# Patient Record
Sex: Female | Born: 1995 | Race: White | Hispanic: No | Marital: Single | Smoking: Current some day smoker
Health system: Southern US, Community
[De-identification: ages and names within clinical notes are randomized; demographics above are authoritative.]

---

## 2015-03-06 ENCOUNTER — Emergency Department (HOSPITAL_COMMUNITY): Payer: Medicaid - Out of State

## 2015-03-06 ENCOUNTER — Encounter (HOSPITAL_COMMUNITY): Payer: Self-pay | Admitting: *Deleted

## 2015-03-06 ENCOUNTER — Emergency Department (HOSPITAL_COMMUNITY)
Admission: EM | Admit: 2015-03-06 | Discharge: 2015-03-06 | Disposition: A | Discharge status: Home / Self Care | Payer: Medicaid - Out of State | Attending: Emergency Medicine | Admitting: Emergency Medicine

## 2015-03-06 DIAGNOSIS — Y9389 Activity, other specified: Secondary | ICD-10-CM | POA: Diagnosis not present

## 2015-03-06 DIAGNOSIS — S93401A Sprain of unspecified ligament of right ankle, initial encounter: Secondary | ICD-10-CM

## 2015-03-06 DIAGNOSIS — Y998 Other external cause status: Secondary | ICD-10-CM | POA: Insufficient documentation

## 2015-03-06 DIAGNOSIS — Z720 Tobacco use: Secondary | ICD-10-CM | POA: Insufficient documentation

## 2015-03-06 DIAGNOSIS — Y9289 Other specified places as the place of occurrence of the external cause: Secondary | ICD-10-CM | POA: Diagnosis not present

## 2015-03-06 DIAGNOSIS — S99911A Unspecified injury of right ankle, initial encounter: Secondary | ICD-10-CM | POA: Diagnosis present

## 2015-03-06 DIAGNOSIS — W1839XA Other fall on same level, initial encounter: Secondary | ICD-10-CM | POA: Diagnosis not present

## 2015-03-06 MED ORDER — HYDROCODONE-ACETAMINOPHEN 5-325 MG PO TABS
1.0000 | ORAL_TABLET | Freq: Once | ORAL | Status: AC
  Administered 2015-03-06: 1 via ORAL
  Filled 2015-03-06: qty 1

## 2015-03-06 MED ORDER — HYDROCODONE-ACETAMINOPHEN 5-325 MG PO TABS: 1.0000 | ORAL_TABLET | ORAL | Status: DC | PRN

## 2015-03-06 MED ORDER — IBUPROFEN 800 MG PO TABS: 800.0000 mg | ORAL_TABLET | Freq: Three times a day (TID) | ORAL | Status: DC

## 2015-03-06 NOTE — Discharge Instructions (Signed)
Nonweightbearing for the next 48-72 hours. After 72 hours, slowly start bearing weight. When you can walk without pain or limp, discontinue the crutches.  Ankle Sprain An ankle sprain is an injury to the strong, fibrous tissues (ligaments) that hold the bones of your ankle joint together.  CAUSES An ankle sprain is usually caused by a fall or by twisting your ankle. Ankle sprains most commonly occur when you step on the outer edge of your foot, and your ankle turns inward. People who participate in sports are more prone to these types of injuries.  SYMPTOMS   Pain in your ankle. The pain may be present at rest or only when you are trying to stand or walk.  Swelling.  Bruising. Bruising may develop immediately or within 1 to 2 days after your injury.  Difficulty standing or walking, particularly when turning corners or changing directions. DIAGNOSIS  Your caregiver will ask you details about your injury and perform a physical exam of your ankle to determine if you have an ankle sprain. During the physical exam, your caregiver will press on and apply pressure to specific areas of your foot and ankle. Your caregiver will try to move your ankle in certain ways. An X-ray exam may be done to be sure a bone was not broken or a ligament did not separate from one of the bones in your ankle (avulsion fracture).  TREATMENT  Certain types of braces can help stabilize your ankle. Your caregiver can make a recommendation for this. Your caregiver may recommend the use of medicine for pain. If your sprain is severe, your caregiver may refer you to a surgeon who helps to restore function to parts of your skeletal system (orthopedist) or a physical therapist. HOME CARE INSTRUCTIONS   Apply ice to your injury for 1-2 days or as directed by your caregiver. Applying ice helps to reduce inflammation and pain.  Put ice in a plastic bag.  Place a towel between your skin and the bag.  Leave the ice on for 15-20  minutes at a time, every 2 hours while you are awake.  Only take over-the-counter or prescription medicines for pain, discomfort, or fever as directed by your caregiver.  Elevate your injured ankle above the level of your heart as much as possible for 2-3 days.  If your caregiver recommends crutches, use them as instructed. Gradually put weight on the affected ankle. Continue to use crutches or a cane until you can walk without feeling pain in your ankle.  If you have a plaster splint, wear the splint as directed by your caregiver. Do not rest it on anything harder than a pillow for the first 24 hours. Do not put weight on it. Do not get it wet. You may take it off to take a shower or bath.  You may have been given an elastic bandage to wear around your ankle to provide support. If the elastic bandage is too tight (you have numbness or tingling in your foot or your foot becomes cold and blue), adjust the bandage to make it comfortable.  If you have an air splint, you may blow more air into it or let air out to make it more comfortable. You may take your splint off at night and before taking a shower or bath. Wiggle your toes in the splint several times per day to decrease swelling. SEEK MEDICAL CARE IF:   You have rapidly increasing bruising or swelling.  Your toes feel extremely cold or you  lose feeling in your foot.  Your pain is not relieved with medicine. SEEK IMMEDIATE MEDICAL CARE IF:  Your toes are numb or blue.  You have severe pain that is increasing. MAKE SURE YOU:   Understand these instructions.  Will watch your condition.  Will get help right away if you are not doing well or get worse. Document Released: 06/19/2005 Document Revised: 03/13/2012 Document Reviewed: 07/01/2011 [facility] Patient Information 2015 [place], [place]. This information is not intended to replace advice given to you by your health care provider. Make sure you discuss any questions you have with your  health care provider.

## 2015-03-06 NOTE — ED Notes (Signed)
Pt is in stable condition upon d/c and is escorted from ED via wheelchair. 

## 2015-03-06 NOTE — ED Provider Notes (Signed)
CSN: [id number]     Arrival date & time 03/06/15  0907 History   First MD Initiated Contact with Patient 03/06/15 [phone]     Chief Complaint  Patient presents with  . Ankle Pain     HPI  Patient presents for evaluation of an ankle injury.  Ankle last night fell to ground. No additional areas of pain or injury. Swollen and tender ankle this morning. Cannot walk without limp.  History reviewed. No pertinent past medical history. History reviewed. No pertinent past surgical history. History reviewed. No pertinent family history. Social History  Substance Use Topics  . Smoking status: Current Some Day Smoker  . Smokeless tobacco: Never Used  . Alcohol Use: Yes     Comment: ocassionally   OB History    No data available     Review of Systems  Constitutional: Negative for fever, chills, diaphoresis, appetite change and fatigue.  HENT: Negative for mouth sores, sore throat and trouble swallowing.   Eyes: Negative for visual disturbance.  Respiratory: Negative for cough, chest tightness, shortness of breath and wheezing.   Cardiovascular: Negative for chest pain.  Gastrointestinal: Negative for nausea, vomiting, abdominal pain, diarrhea and abdominal distention.  Endocrine: Negative for polydipsia, polyphagia and polyuria.  Genitourinary: Negative for dysuria, frequency and hematuria.  Musculoskeletal: Positive for arthralgias and gait problem.       Right lateral ankle pain and swelling.  Skin: Negative for color change, pallor and rash.  Neurological: Negative for dizziness, syncope, light-headedness and headaches.  Hematological: Does not bruise/bleed easily.  Psychiatric/Behavioral: Negative for behavioral problems and confusion.      Allergies  Review of patient's allergies indicates no known allergies.  Home Medications   Prior to Admission medications   Medication Sig Start Date End Date Taking? Authorizing Provider  HYDROcodone-acetaminophen (NORCO/VICODIN) 5-325 MG  per tablet Take 1 tablet by mouth every 4 (four) hours as needed. 03/06/15   Rolland Porter, MD  ibuprofen (ADVIL,MOTRIN) 800 MG tablet Take 1 tablet (800 mg total) by mouth 3 (three) times daily. 03/06/15   Rolland Porter, MD   BP 114/67 mmHg  Pulse 91  Temp(Src) 98.1 F (36.7 C) (Oral)  Resp 17  Ht 5\' 2"  (1.575 m)  Wt 125 lb (56.7 kg)  BMI 22.86 kg/m2  SpO2 100%  LMP 02/03/2015 Physical Exam  Constitutional: She is oriented to person, place, and time. She appears well-developed and well-nourished. No distress.  HENT:  Head: Normocephalic.  Eyes: Conjunctivae are normal. Pupils are equal, round, and reactive to light. No scleral icterus.  Neck: Normal range of motion. Neck supple. No thyromegaly present.  Cardiovascular: Normal rate and regular rhythm.  Exam reveals no gallop and no friction rub.   No murmur heard. Pulmonary/Chest: Effort normal and breath sounds normal. No respiratory distress. She has no wheezes. She has no rales.  Abdominal: Soft. Bowel sounds are normal. She exhibits no distension. There is no tenderness. There is no rebound.  Musculoskeletal: Normal range of motion.       Feet:  Neurological: She is alert and oriented to person, place, and time.  Skin: Skin is warm and dry. No rash noted.  Psychiatric: She has a normal mood and affect. Her behavior is normal.    ED Course  Procedures (including critical care time) Labs Review Labs Reviewed - No data to display  Imaging Review Dg Ankle Complete Right  03/06/2015   CLINICAL DATA:  19 year old female with right lateral ankle pain and swelling for the past  day after a fall yesterday evening.  EXAM: RIGHT ANKLE - COMPLETE 3+ VIEW  COMPARISON:  No priors.  FINDINGS: Three views of the right ankle demonstrate extensive swelling overlying the lateral malleolus. No acute displaced fracture, subluxation or dislocation.  IMPRESSION: 1. Soft tissue swelling overlying the lateral malleolus, without underlying acute bony trauma.    Electronically Signed   By: Trudie Reed M.D.   On: 03/06/2015 10:18   I have personally reviewed and evaluated these images and lab results as part of my medical decision-making.   EKG Interpretation None      MDM   Final diagnoses:  Ankle sprain, right, initial encounter    Ace wrap crutches conservative treatment.    Rolland Porter, MD 03/06/15 1030

## 2015-03-06 NOTE — ED Notes (Signed)
Patient transported to X-ray 

## 2015-03-06 NOTE — ED Notes (Signed)
Pt arrives from home. Pt states she fell and hurt her right ankle last night. R ankle is swollen and tender to touch.

## 2016-03-22 ENCOUNTER — Emergency Department (HOSPITAL_COMMUNITY): Payer: Medicaid - Out of State

## 2016-03-22 ENCOUNTER — Emergency Department (HOSPITAL_COMMUNITY)
Admission: EM | Admit: 2016-03-22 | Discharge: 2016-03-22 | Disposition: A | Discharge status: Home / Self Care | Payer: Medicaid - Out of State | Attending: Emergency Medicine | Admitting: Emergency Medicine

## 2016-03-22 ENCOUNTER — Encounter (HOSPITAL_COMMUNITY): Payer: Self-pay | Admitting: Emergency Medicine

## 2016-03-22 DIAGNOSIS — Y929 Unspecified place or not applicable: Secondary | ICD-10-CM | POA: Diagnosis not present

## 2016-03-22 DIAGNOSIS — Y999 Unspecified external cause status: Secondary | ICD-10-CM | POA: Insufficient documentation

## 2016-03-22 DIAGNOSIS — X500XXA Overexertion from strenuous movement or load, initial encounter: Secondary | ICD-10-CM | POA: Insufficient documentation

## 2016-03-22 DIAGNOSIS — M25531 Pain in right wrist: Secondary | ICD-10-CM

## 2016-03-22 DIAGNOSIS — Y9389 Activity, other specified: Secondary | ICD-10-CM | POA: Insufficient documentation

## 2016-03-22 DIAGNOSIS — F17200 Nicotine dependence, unspecified, uncomplicated: Secondary | ICD-10-CM | POA: Insufficient documentation

## 2016-03-22 MED ORDER — IBUPROFEN 800 MG PO TABS: 800.0000 mg | ORAL_TABLET | Freq: Three times a day (TID) | ORAL | 0 refills | Status: AC | PRN

## 2016-03-22 MED ORDER — IBUPROFEN 800 MG PO TABS
800.0000 mg | ORAL_TABLET | Freq: Once | ORAL | Status: AC
  Administered 2016-03-22: 800 mg via ORAL
  Filled 2016-03-22: qty 1

## 2016-03-22 NOTE — ED Triage Notes (Signed)
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  Today she picked up a heavy back pack and then also fell on her right wrist.  She rates her pain as an 8.  She applied ice but that did not help the pain.  She took 2 Advil at 10 AM but that did not help the pain either.

## 2016-03-22 NOTE — Discharge Instructions (Signed)
Read the information below.  Use the prescribed medication as directed.  Please discuss all new medications with your pharmacist.  You may return to the Emergency Department at any time for worsening condition or any new symptoms that concern you.   If you develop uncontrolled pain, weakness or numbness of the extremity, severe discoloration of the skin, or you are unable to use your hand, return to the ER for a recheck.    °

## 2016-03-22 NOTE — ED Provider Notes (Signed)
[facility]-EMERGENCY DEPT Provider Note   CSN: [id number] Arrival date & time: 03/22/16  1445  By signing my name below, I, Sandrea HammondStephen Dignan, attest that this documentation has been prepared under the direction and in the presence of [facility] HospitalEmily Jeroline Wolbert, PA-C. Electronically Signed: Sandrea HammondStephen Dignan, ED Scribe. 03/22/16. 3:47 PM.   History   Chief Complaint Chief Complaint  Patient presents with  . Wrist Pain    HPI Comments: Nichole EarlsMia Bullock is a 20 y.o. female who presents to the Emergency Department complaining of moderate right wrist pain initial onset last week and worsed today s/p injury. Pt developed pain last week while making pottery and reports that her pain has been gradually worsening until today. Per pt, she lifted a heavy backpack which significantly worsened her pain and then subsequently she fell onto her right wrist. Pt denies LOC, head injury, or additional injuries. Pt denies numbness and tingling or weakness. Pt is right-handed.   The history is provided by the patient. No language interpreter was used.    History reviewed. No pertinent past medical history.  There are no active problems to display for this patient.   History reviewed. No pertinent surgical history.  OB History    No data available       Home Medications    Prior to Admission medications   Medication Sig Start Date End Date Taking? Authorizing Provider  ibuprofen (ADVIL,MOTRIN) 800 MG tablet Take 1 tablet (800 mg total) by mouth every 8 (eight) hours as needed for mild pain or moderate pain. 03/22/16   Trixie DredgeEmily Manoah Deckard, PA-C    Family History No family history on file.  Social History Social History  Substance Use Topics  . Smoking status: Current Some Day Smoker  . Smokeless tobacco: Never Used  . Alcohol use No     Comment: ocassionally     Allergies   Review of patient's allergies indicates no known allergies.   Review of Systems Review of Systems  Constitutional: Negative for fever.    Musculoskeletal: Positive for arthralgias. Negative for myalgias.  Skin: Negative for color change, pallor and wound.  Allergic/Immunologic: Negative for immunocompromised state.  Neurological: Negative for syncope, weakness and numbness.       -tingling  Psychiatric/Behavioral: Negative for self-injury.    Physical Exam Updated Vital Signs BP 107/61 (BP Location: Left Arm)   Pulse 92   Temp 97.9 F (36.6 C)   Resp 20   LMP 03/08/2016 (Approximate)   SpO2 95%   Physical Exam  Constitutional: She appears well-developed and well-nourished. No distress.  HENT:  Head: Normocephalic and atraumatic.  Neck: Neck supple.  Pulmonary/Chest: Effort normal.  Musculoskeletal: She exhibits tenderness.  RUE: Tenderness throughout wrist and across the distal ulna No other focal bony tenderness No anatomic snuff box tenderness Full active range of motion of all digits, strength 5/5, sensation intact, capillary refill < 2 seconds.    Neurological: She is alert.  Skin: She is not diaphoretic.  Nursing note and vitals reviewed.    ED Treatments / Results   DIAGNOSTIC STUDIES: Oxygen Saturation is 95% on RA, adequate by my interpretation.    COORDINATION OF CARE: 3:33 PM Discussed treatment plan with pt at bedside which includes xray studies and pt agreed to plan.   Labs (all labs ordered are listed, but only abnormal results are displayed) Labs Reviewed - No data to display  EKG  EKG Interpretation None       Radiology Dg Forearm Right  Result Date: 03/22/2016 CLINICAL  DATA:  Fall, mid forearm pain EXAM: RIGHT FOREARM - 2 VIEW COMPARISON:  None. FINDINGS: Two views of the right forearm submitted. No acute fracture or subluxation. There is negative ulnar variance. IMPRESSION: No acute fracture or subluxation.  Negative ulnar variance. Electronically Signed   By: Natasha Mead M.D.   On: 03/22/2016 16:26   Dg Hand Complete Right  Result Date: 03/22/2016 CLINICAL DATA:  Fall on  an outstretched hand. EXAM: RIGHT HAND - COMPLETE 3+ VIEW COMPARISON:  None. FINDINGS: There is no evidence of fracture or dislocation. There is no evidence of arthropathy or other focal bone abnormality. Soft tissues are unremarkable. IMPRESSION: Negative. Electronically Signed   By: Kennith Center M.D.   On: 03/22/2016 16:26    Procedures Procedures (including critical care time)  Medications Ordered in ED Medications  ibuprofen (ADVIL,MOTRIN) tablet 800 mg (800 mg Oral Given 03/22/16 1636)     Initial Impression / Assessment and Plan / ED Course  I have reviewed the triage vital signs and the nursing notes.  Pertinent labs & imaging results that were available during my care of the patient were reviewed by me and considered in my medical decision making (see chart for details).  Clinical Course    Afebrile, nontoxic patient with injury to her right wrist while throwing pots, lifting a backpack, then falling on outstretched hand.  Exam somewhat unclear for injury, multiple mechanisms mostly chronic use, no red flags.  Xrays negative.   D/C home with velcro wrist splint, NSAIDs, hand follow up if needed.  Discussed result, findings, treatment, and follow up  with patient.  Pt given return precautions.  Pt verbalizes understanding and agrees with plan.      Final Clinical Impressions(s) / ED Diagnoses   Final diagnoses:  Right wrist pain    New Prescriptions Discharge Medication List as of 03/22/2016  5:07 PM     I personally performed the services described in this documentation, which was scribed in my presence. The recorded information has been reviewed and is accurate.      Trixie Dredge, PA-C 03/22/16 1746    Linwood Dibbles, MD 03/22/16 2351

## 2017-02-13 IMAGING — CR DG HAND COMPLETE 3+V*R*
3 series · 3 of 3 positions shown · non-contrast
Comparison: None.

CLINICAL DATA: Fall on an outstretched hand.

EXAM:
RIGHT HAND - COMPLETE 3+ VIEW

[x hand lat right]
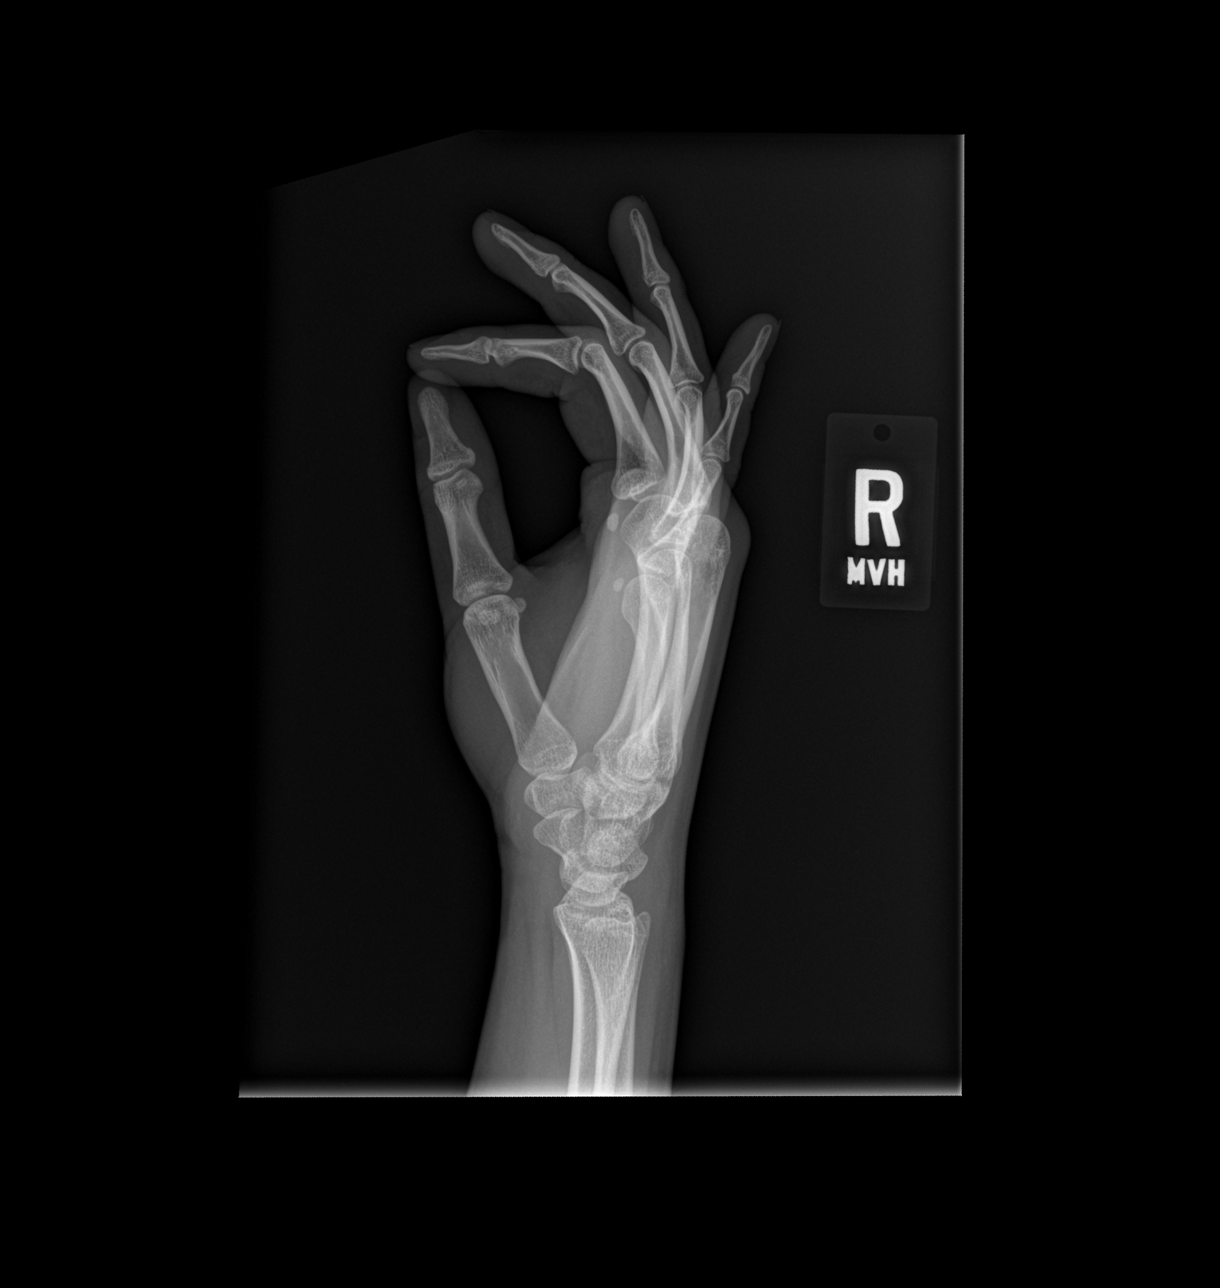

[x hand obl right]
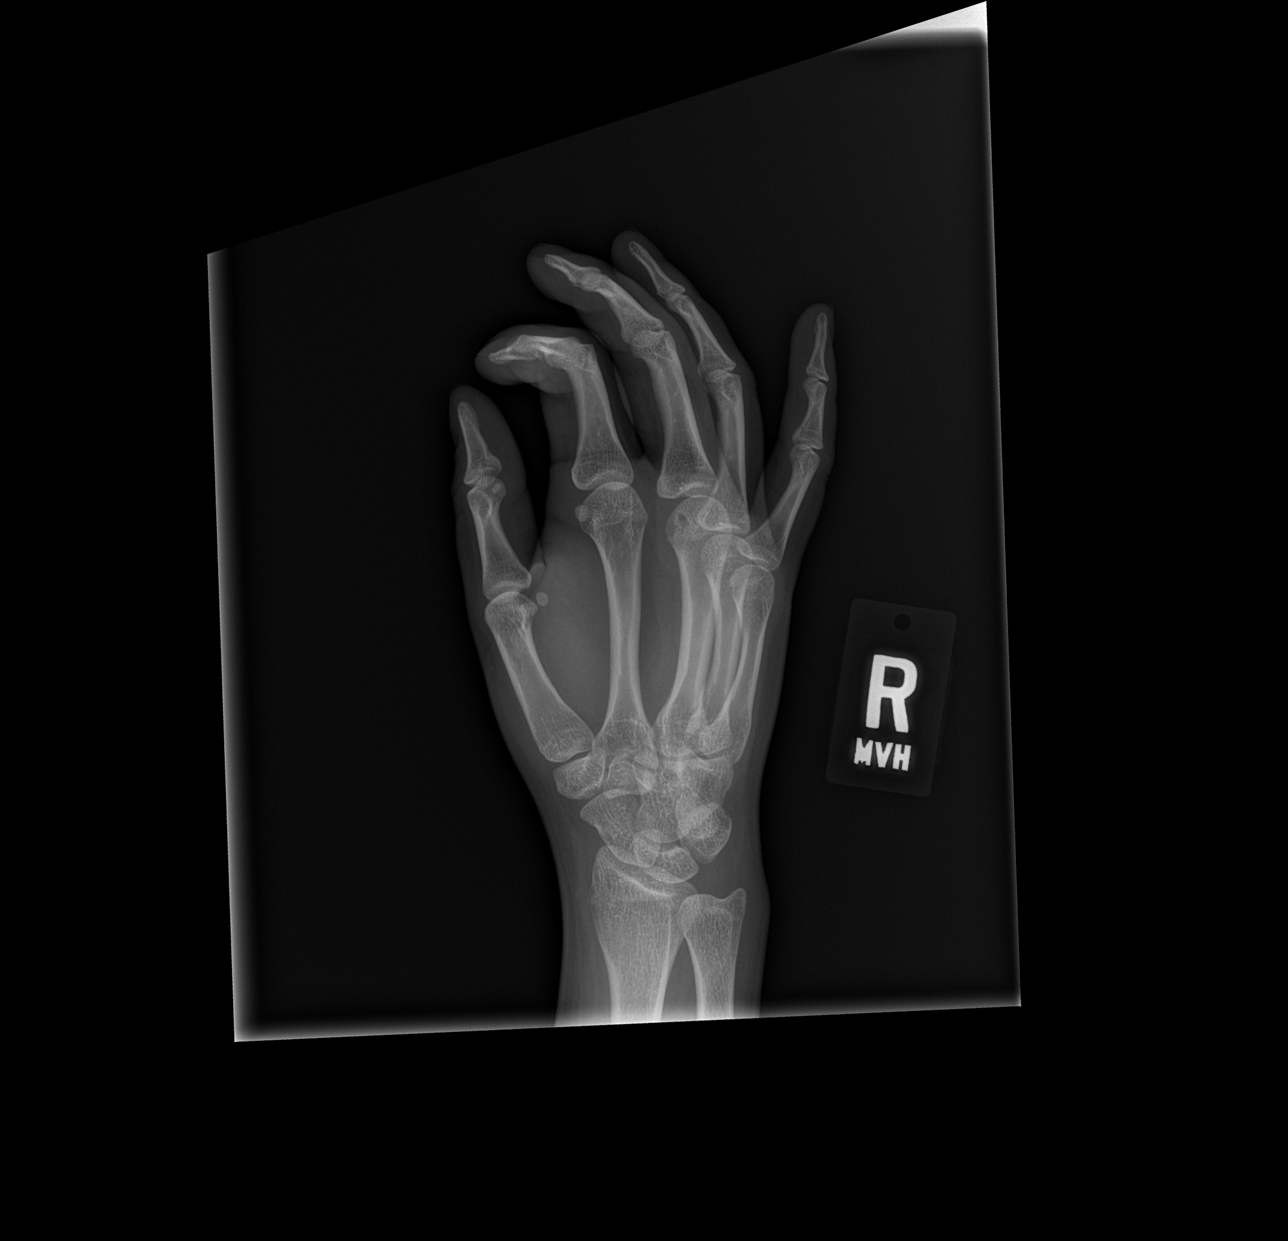

[x hand pa right]
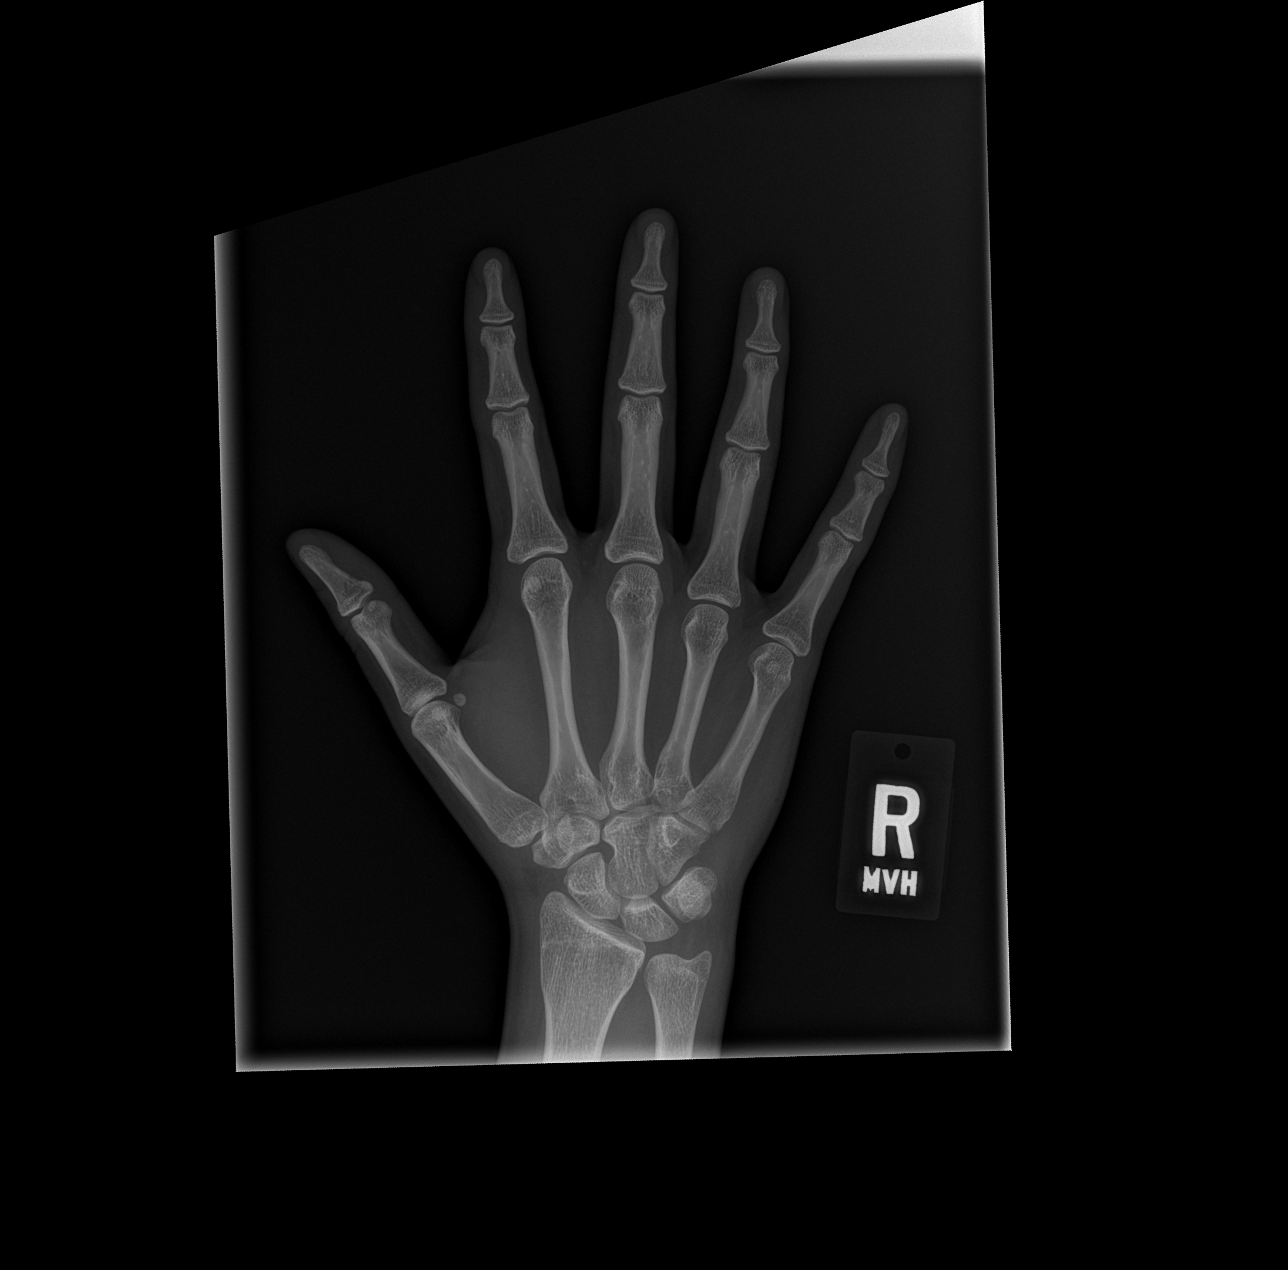

[3 of 3 positions shown; findings below may reference images not displayed]

FINDINGS: There is no evidence of fracture or dislocation. There is no
evidence of arthropathy or other focal bone abnormality. Soft
tissues are unremarkable.
IMPRESSION: Negative.
